# Patient Record
Sex: Male | Born: 1980 | Race: White | Hispanic: No | Marital: Single | Smoking: Never smoker
Health system: Southern US, Community
[De-identification: ages and names within clinical notes are randomized; demographics above are authoritative.]

---
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
## 1999-10-19 ENCOUNTER — Emergency Department (HOSPITAL_COMMUNITY): Admission: EM | Admit: 1999-10-19 | Discharge: 1999-10-19 | Payer: Self-pay | Admitting: Emergency Medicine

## 2006-03-09 ENCOUNTER — Emergency Department (HOSPITAL_COMMUNITY): Admission: EM | Admit: 2006-03-09 | Discharge: 2006-03-09 | Payer: Self-pay | Admitting: Emergency Medicine

## 2007-07-03 ENCOUNTER — Emergency Department (HOSPITAL_COMMUNITY): Admission: EM | Admit: 2007-07-03 | Discharge: 2007-07-03 | Payer: Self-pay | Admitting: Emergency Medicine

## 2008-12-18 ENCOUNTER — Emergency Department (HOSPITAL_COMMUNITY): Admission: EM | Admit: 2008-12-18 | Discharge: 2008-12-19 | Payer: Self-pay | Admitting: Emergency Medicine

## 2009-01-01 ENCOUNTER — Emergency Department (HOSPITAL_COMMUNITY): Admission: EM | Admit: 2009-01-01 | Discharge: 2009-01-01 | Payer: Self-pay | Admitting: Family Medicine

## 2010-06-28 LAB — CBC
HCT: 47 % (ref 39.0–52.0)
Hemoglobin: 16.1 g/dL (ref 13.0–17.0)
MCHC: 34.3 g/dL (ref 30.0–36.0)
Platelets: 231 10*3/uL (ref 150–400)
RDW: 12.5 % (ref 11.5–15.5)

## 2010-06-28 LAB — URINALYSIS, ROUTINE W REFLEX MICROSCOPIC
Bilirubin Urine: NEGATIVE
Ketones, ur: 15 mg/dL — AB
Leukocytes, UA: NEGATIVE
Nitrite: NEGATIVE
Specific Gravity, Urine: 1.015 (ref 1.005–1.030)
Urobilinogen, UA: 0.2 mg/dL (ref 0.0–1.0)
pH: 6 (ref 5.0–8.0)

## 2010-06-28 LAB — BASIC METABOLIC PANEL
BUN: 11 mg/dL (ref 6–23)
CO2: 25 mEq/L (ref 19–32)
GFR calc non Af Amer: >60 mL/min (ref >60)
Glucose, Bld: 139 mg/dL — ABNORMAL HIGH (ref 70–99)
Potassium: 4 mEq/L (ref 3.5–5.1)
Sodium: 133 mEq/L — ABNORMAL LOW (ref 135–145)

## 2010-06-28 LAB — DIFFERENTIAL
Basophils Absolute: 0 10*3/uL (ref 0.0–0.1)
Basophils Relative: 0 % (ref 0–1)
Eosinophils Absolute: 0.1 10*3/uL (ref 0.0–0.7)
Eosinophils Relative: 1 % (ref 0–5)
Monocytes Absolute: 0.7 10*3/uL (ref 0.1–1.0)

## 2010-06-28 LAB — URINE MICROSCOPIC-ADD ON
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
## 2015-01-07 ENCOUNTER — Emergency Department (HOSPITAL_COMMUNITY)
Admission: EM | Admit: 2015-01-07 | Discharge: 2015-01-07 | Disposition: A | Discharge status: Home / Self Care | Payer: BLUE CROSS/BLUE SHIELD | Attending: Emergency Medicine | Admitting: Emergency Medicine

## 2015-01-07 ENCOUNTER — Encounter (HOSPITAL_COMMUNITY): Payer: Self-pay | Admitting: *Deleted

## 2015-01-07 DIAGNOSIS — R42 Dizziness and giddiness: Secondary | ICD-10-CM | POA: Diagnosis not present

## 2015-01-07 DIAGNOSIS — R55 Syncope and collapse: Secondary | ICD-10-CM | POA: Insufficient documentation

## 2015-01-07 DIAGNOSIS — I10 Essential (primary) hypertension: Secondary | ICD-10-CM | POA: Diagnosis not present

## 2015-01-07 DIAGNOSIS — Z880 Allergy status to penicillin: Secondary | ICD-10-CM | POA: Insufficient documentation

## 2015-01-07 LAB — BASIC METABOLIC PANEL
Anion gap: 9 (ref 5–15)
BUN: 12 mg/dL (ref 6–20)
CHLORIDE: 99 mmol/L — AB (ref 101–111)
CO2: 26 mmol/L (ref 22–32)
CREATININE: 1.16 mg/dL (ref 0.61–1.24)
Calcium: 9.4 mg/dL (ref 8.9–10.3)
GFR calc Af Amer: >60 mL/min (ref >60)
GFR calc non Af Amer: >60 mL/min (ref >60)
Glucose, Bld: 123 mg/dL — ABNORMAL HIGH (ref 65–99)
Potassium: 3.9 mmol/L (ref 3.5–5.1)
SODIUM: 134 mmol/L — AB (ref 135–145)

## 2015-01-07 LAB — URINALYSIS, ROUTINE W REFLEX MICROSCOPIC
BILIRUBIN URINE: NEGATIVE
GLUCOSE, UA: NEGATIVE mg/dL
Hgb urine dipstick: NEGATIVE
KETONES UR: NEGATIVE mg/dL
Leukocytes, UA: NEGATIVE
NITRITE: NEGATIVE
PH: 7 (ref 5.0–8.0)
Protein, ur: NEGATIVE mg/dL
Specific Gravity, Urine: 1.005 (ref 1.005–1.030)
Urobilinogen, UA: 0.2 mg/dL (ref 0.0–1.0)

## 2015-01-07 LAB — CBC
HCT: 46.2 % (ref 39.0–52.0)
Hemoglobin: 16.2 g/dL (ref 13.0–17.0)
MCH: 30.7 pg (ref 26.0–34.0)
MCHC: 35.1 g/dL (ref 30.0–36.0)
MCV: 87.5 fL (ref 78.0–100.0)
PLATELETS: 257 10*3/uL (ref 150–400)
RBC: 5.28 MIL/uL (ref 4.22–5.81)
RDW: 12.2 % (ref 11.5–15.5)
WBC: 9.4 10*3/uL (ref 4.0–10.5)

## 2015-01-07 LAB — D-DIMER, QUANTITATIVE: D-Dimer, Quant: <0.27 ug/mL-FEU (ref 0.00–0.48)

## 2015-01-07 LAB — CBG MONITORING, ED: Glucose-Capillary: 113 mg/dL — ABNORMAL HIGH (ref 65–99)

## 2015-01-07 MED ORDER — SODIUM CHLORIDE 0.9 % IV BOLUS (SEPSIS)
1000.0000 mL | Freq: Once | INTRAVENOUS | Status: AC
  Administered 2015-01-07: 1000 mL via INTRAVENOUS

## 2015-01-07 NOTE — Discharge Instructions (Signed)

## 2015-01-07 NOTE — ED Notes (Signed)
Patient left at this time with all belongings. 

## 2015-01-07 NOTE — ED Notes (Signed)
Pt reports dizziness x 2 weeks, occurs more when standing up. Reports fatigue and near syncopal episodes, usually relieved with rest and eating/drinking. Checked bp today and it was elevated.

## 2015-01-07 NOTE — ED Notes (Signed)
Pt ambulates in hall independently and with a steady gait.

## 2015-01-07 NOTE — ED Provider Notes (Signed)
CSN: [id number]     Arrival date & time 01/07/15  1814 History   First MD Initiated Contact with Patient 01/07/15 1850     Chief Complaint  Patient presents with  . Dizziness  . Hypertension  . Near Syncope     (Consider location/radiation/quality/duration/timing/severity/associated sxs/prior Treatment) HPI Comments: Patient presents with dizziness. He states over last 2 weeks she's had intermittent episodes of feeling lightheaded. This typically happens after mild exertion. He denies any vertiginous-type symptoms. There is no headache. He denies any chest pain or shortness of breath. 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 He states that his lightheadedness resolved after he eats something. He denies abdominal pain. There is no nausea or vomiting. He's having normal bowel movements. There is no urinary symptoms. He denies any ataxia. He denies any speech deficits or vision changes. There is no numbness or weakness to his extremities. He denies any leg swelling although he states he can see his sock lines a little bit more than he normally does.  Patient is a 34 y.o. male presenting with dizziness, hypertension, and near-syncope.  Dizziness Associated symptoms: no blood in stool, no chest pain, no diarrhea, no headaches, no nausea, no shortness of breath, no vomiting and no weakness   Hypertension Pertinent negatives include no chest pain, no abdominal pain, no headaches and no shortness of breath.  Near Syncope Pertinent negatives include no chest pain, no abdominal pain, no headaches and no shortness of breath.    History reviewed. No pertinent past medical history. History reviewed. No pertinent past surgical history. History reviewed. No pertinent family history. Social History  Substance Use Topics  . Smoking status: Never Smoker   . Smokeless tobacco: None  . Alcohol Use: No    Review of Systems  Constitutional: Negative for fever, chills, diaphoresis  and fatigue.  HENT: Negative for congestion, rhinorrhea and sneezing.   Eyes: Negative.   Respiratory: Negative for cough, chest tightness and shortness of breath.   Cardiovascular: Positive for near-syncope. Negative for chest pain and leg swelling.  Gastrointestinal: Negative for nausea, vomiting, abdominal pain, diarrhea and blood in stool.  Genitourinary: Negative for frequency, hematuria, flank pain and difficulty urinating.  Musculoskeletal: Negative for back pain and arthralgias.  Skin: Negative for rash.  Neurological: Positive for dizziness and light-headedness. Negative for speech difficulty, weakness, numbness and headaches.      Allergies  Penicillins  Home Medications   Prior to Admission medications   Medication Sig Start Date End Date Taking? Authorizing Provider  diphenhydramine-acetaminophen (TYLENOL PM) 25-500 MG TABS tablet Take 1 tablet by mouth at bedtime as needed.   Yes Historical Provider, MD  Ibuprofen-Diphenhydramine Cit (IBUPROFEN PM) 200-38 MG TABS Take 1 tablet by mouth at bedtime as needed (FOR SLEEP).   Yes Historical Provider, MD   BP 121/79 mmHg  Pulse 71  Temp(Src) 97.3 F (36.3 C) (Oral)  Resp 12  Ht 6' (1.829 m)  SpO2 96% Physical Exam  Constitutional: He is oriented to person, place, and time. He appears well-developed and well-nourished.  HENT:  Head: Normocephalic and atraumatic.  Eyes: Pupils are equal, round, and reactive to light.  Neck: Normal range of motion. Neck supple.  Cardiovascular: Normal rate, regular rhythm and normal heart sounds.   Pulmonary/Chest: Effort normal and breath sounds normal. No respiratory distress. He has no wheezes. He has no rales. He exhibits no tenderness.  Abdominal: Soft. Bowel sounds are normal. There is no tenderness. There is  no rebound and no guarding.  Musculoskeletal: Normal range of motion. He exhibits no edema.  Lymphadenopathy:    He has no cervical adenopathy.  Neurological: He is alert and  oriented to person, place, and time.  Motor 5 out of 5 all extremities, sensation grossly intact to light touch all extremities, cranial nerves intact, no pronator drift, finger-nose intact  Skin: Skin is warm and dry. No rash noted.  Psychiatric: He has a normal mood and affect.    ED Course  Procedures (including critical care time) Labs Review Results for orders placed or performed during the hospital encounter of 01/07/15  Basic metabolic panel  Result Value Ref Range   Sodium 134 (L) 135 - 145 mmol/L   Potassium 3.9 3.5 - 5.1 mmol/L   Chloride 99 (L) 101 - 111 mmol/L   CO2 26 22 - 32 mmol/L   Glucose, Bld 123 (H) 65 - 99 mg/dL   BUN 12 6 - 20 mg/dL   Creatinine, Ser 1.61 0.61 - 1.24 mg/dL   Calcium 9.4 8.9 - 09.6 mg/dL   GFR calc non Af Amer >60 >60 mL/min   GFR calc Af Amer >60 >60 mL/min   Anion gap 9 5 - 15  CBC  Result Value Ref Range   WBC 9.4 4.0 - 10.5 K/uL   RBC 5.28 4.22 - 5.81 MIL/uL   Hemoglobin 16.2 13.0 - 17.0 g/dL   HCT 04.5 40.9 - 81.1 %   MCV 87.5 78.0 - 100.0 fL   MCH 30.7 26.0 - 34.0 pg   MCHC 35.1 30.0 - 36.0 g/dL   RDW 91.4 78.2 - 95.6 %   Platelets 257 150 - 400 K/uL  Urinalysis, Routine w reflex microscopic (not at [facility])  Result Value Ref Range   Color, Urine YELLOW YELLOW   APPearance CLOUDY (A) CLEAR   Specific Gravity, Urine 1.005 1.005 - 1.030   pH 7.0 5.0 - 8.0   Glucose, UA NEGATIVE NEGATIVE mg/dL   Hgb urine dipstick NEGATIVE NEGATIVE   Bilirubin Urine NEGATIVE NEGATIVE   Ketones, ur NEGATIVE NEGATIVE mg/dL   Protein, ur NEGATIVE NEGATIVE mg/dL   Urobilinogen, UA 0.2 0.0 - 1.0 mg/dL   Nitrite NEGATIVE NEGATIVE   Leukocytes, UA NEGATIVE NEGATIVE  D-dimer, quantitative  Result Value Ref Range   D-Dimer, Quant <0.27 0.00 - 0.48 ug/mL-FEU  CBG monitoring, ED  Result Value Ref Range   Glucose-Capillary 113 (H) 65 - 99 mg/dL   No results found.    Imaging Review No results found. I have personally reviewed and evaluated these  images and lab results as part of my medical decision-making.   EKG Interpretation   Date/Time:  Sunday January 07 2015 18:23:31 EDT Ventricular Rate:  98 PR Interval:  158 QRS Duration: 102 QT Interval:  328 QTC Calculation: 418 R Axis:   64 Text Interpretation:  Normal sinus rhythm Abnormal QRS-T angle, consider  primary T wave abnormality Abnormal ECG No old tracing to compare  Confirmed by Boomer Winders  MD, Gerold Sar ([id number]) on 01/07/2015 7:05:35 PM      MDM   Final diagnoses:  Dizziness    Patient presents with lightheadedness intermittently for the last 2 weeks. His labs are unremarkable. His EKG is non-concerning. He doesn't have any symptoms that sound more consistent with ACS. He has no persistent tachycardia, shortness of breath, hypoxia or other symptoms I'll be concerning for PE. His symptoms have currently resolved. He's ambulating without difficulty. His heart rate is improved to 71.  His blood pressure stable. He was discharged home in good condition. He was given outpatient referrals for follow-up.    Rolan Bucco, MD 01/07/15 2231

## 2018-08-25 ENCOUNTER — Ambulatory Visit
Admission: EM | Admit: 2018-08-25 | Discharge: 2018-08-25 | Disposition: A | Discharge status: Home / Self Care | Payer: Managed Care, Other (non HMO) | Attending: Emergency Medicine | Admitting: Emergency Medicine

## 2018-08-25 ENCOUNTER — Other Ambulatory Visit: Payer: Self-pay

## 2018-08-25 DIAGNOSIS — J029 Acute pharyngitis, unspecified: Secondary | ICD-10-CM | POA: Diagnosis present

## 2018-08-25 LAB — POCT RAPID STREP A (OFFICE): Rapid Strep A Screen: NEGATIVE

## 2018-08-25 MED ORDER — AZITHROMYCIN 250 MG PO TABS: 250.0000 mg | ORAL_TABLET | Freq: Every day | ORAL | 0 refills | Status: AC

## 2018-08-25 NOTE — ED Triage Notes (Signed)
Began having  sore throat 2 days ago, pt describes white patches on throat

## 2018-08-25 NOTE — Discharge Instructions (Addendum)
Strep test negative, will send out for culture and we will call you with results However, based on symptoms and physical exam I will treat you for potential strep Get plenty of rest and push fluids Drink warm or cool liquids, use throat lozenges, or popsicles to help alleviate symptoms Take OTC ibuprofen or tylenol as needed for pain Follow up with PCP if symptoms persists.  If you do not have a local PCP, I have attached a list of local PCPs you can follow up with Call or go to ER if patient has any new or worsening symptoms such as fever, chills, nausea, vomiting, worsening sore throat, cough, abdominal pain, chest pain, changes in bowel or bladder habits, etc...  Declines COVID testing today.   In the meantime: You should remain isolated in your home for 7 days from symptom onset AND greater than 72 hours after symptoms resolution (absence of fever without the use of fever-reducing medication and improvement in respiratory symptoms), whichever is longer

## 2018-08-25 NOTE — ED Provider Notes (Signed)
[facility]   [id number] 08/25/18 Arrival Time: 1614  ZH:YQMVCC:SORE THROAT  SUBJECTIVE: History from: patient.  Brandon Spencer is a 38 y.o. male who presents with sore throat x couple of days.  Denies sick exposure to strep, flu or mono, or precipitating event.  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  [place]  Has NOT tried OTC medication.  Symptoms are made worse with swallowing, but tolerating liquids and own secretions without difficulty.  Denies previous symptoms in the past.   Denies fever, chills, fatigue, ear pain, rhinorrhea, nasal congestion, cough, SOB, wheezing, chest pain, chest pressure, nausea, rash, changes in bowel or bladder habits.    ROS: As per HPI.  History reviewed. No pertinent past medical history. History reviewed. No pertinent surgical history. Allergies  Allergen Reactions  . Penicillins Other (See Comments)   No current facility-administered medications on file prior to encounter.    Current Outpatient Medications on File Prior to Encounter  Medication Sig Dispense Refill  . diphenhydramine-acetaminophen (TYLENOL PM) 25-500 MG TABS tablet Take 1 tablet by mouth at bedtime as needed.    . Ibuprofen-Diphenhydramine Cit (IBUPROFEN PM) 200-38 MG TABS Take 1 tablet by mouth at bedtime as needed (FOR SLEEP).     Social History   Socioeconomic History  . Marital status: Single    Spouse name: Not on file  . Number of children: Not on file  . Years of education: Not on file  . Highest education level: Not on file  Occupational History  . Not on file  Social Needs  . Financial resource strain: Not on file  . Food insecurity:    Worry: Not on file    Inability: Not on file  . Transportation needs:    Medical: Not on file    Non-medical: Not on file  Tobacco Use  . Smoking status: Never Smoker  . Smokeless tobacco: Never Used  Substance and Sexual Activity  . Alcohol use: No  . Drug use: No  . Sexual activity: Not  on file  Lifestyle  . Physical activity:    Days per week: Not on file    Minutes per session: Not on file  . Stress: Not on file  Relationships  . Social connections:    Talks on phone: Not on file    Gets together: Not on file    Attends religious service: Not on file    Active member of club or organization: Not on file    Attends meetings of clubs or organizations: Not on file    Relationship status: Not on file  . Intimate partner violence:    Fear of current or ex partner: Not on file    Emotionally abused: Not on file    Physically abused: Not on file    Forced sexual activity: Not on file  Other Topics Concern  . Not on file  Social History Narrative  . Not on file   History reviewed. No pertinent family history.  OBJECTIVE:  Vitals:   08/25/18 1628  BP: (!) 136/92  Pulse: 92  Resp: 18  Temp: 98.2 F (36.8 C)  SpO2: 97%     General appearance: alert; appears mildly fatigued, but nontoxic, speaking in full sentences and managing own secretions HEENT: NCAT; Ears: EACs clear, TMs pearly gray with visible cone of light, without erythema; Eyes: PERRL, EOMI grossly; Nose: no obvious rhinorrhea; Throat: oropharynx clear, tonsils 2+ and mildly erythematous with white tonsillar  exudates, uvula midline Neck: supple without LAD Lungs: CTA bilaterally without adventitious breath sounds; cough absent Heart: regular rate and rhythm.  Radial pulses 2+ symmetrical bilaterally Skin: warm and dry Psychological: alert and cooperative; normal mood and affect  LABS: Results for orders placed or performed during the hospital encounter of 08/25/18 (from the past 24 hour(s))  POCT rapid strep A     Status: None   Collection Time: 08/25/18  4:41 PM  Result Value Ref Range   Rapid Strep A Screen Negative Negative     ASSESSMENT & PLAN:  1. Acute pharyngitis, unspecified etiology     Meds ordered this encounter  Medications  . azithromycin (ZITHROMAX) 250 MG tablet    Sig:  Take 1 tablet (250 mg total) by mouth daily. Take first 2 tablets together, then 1 every day until finished.    Dispense:  6 tablet    Refill:  0    Order Specific Question:   Supervising Provider    Answer:   Eustace Moore [[id number]]     Strep test negative, will send out for culture and we will call you with results However, based on symptoms and physical exam I will treat you for potential strep Get plenty of rest and push fluids Drink warm or cool liquids, use throat lozenges, or popsicles to help alleviate symptoms Take OTC ibuprofen or tylenol as needed for pain Follow up with PCP if symptoms persists.  If you do not have a local PCP, I have attached a list of local PCPs you can follow up with Call or go to ER if patient has any new or worsening symptoms such as fever, chills, nausea, vomiting, worsening sore throat, cough, abdominal pain, chest pain, changes in bowel or bladder habits, etc...  Declines COVID testing today.   In the meantime: You should remain isolated in your home for 7 days from symptom onset AND greater than 72 hours after symptoms resolution (absence of fever without the use of fever-reducing medication and improvement in respiratory symptoms), whichever is longer   Reviewed expectations re: course of current medical issues. Questions answered. Outlined signs and symptoms indicating need for more acute intervention. Patient verbalized understanding. After Visit Summary given.        Brandon Harding, PA-C 08/25/18 1652

## 2018-08-28 LAB — CULTURE, GROUP A STREP (THRC)
